# Patient Record
Sex: Female | Born: 1995 | Race: Black or African American | Hispanic: No | Marital: Single | Smoking: Former smoker
Health system: Southern US, Community
[De-identification: ages and names within clinical notes are randomized; demographics above are authoritative.]

## PROBLEM LIST (undated history)

## (undated) ENCOUNTER — Inpatient Hospital Stay (HOSPITAL_COMMUNITY): Payer: Self-pay

## (undated) DIAGNOSIS — Z789 Other specified health status: Secondary | ICD-10-CM

## (undated) HISTORY — PX: BREAST SURGERY: SHX581

---

## 2016-08-29 ENCOUNTER — Emergency Department (HOSPITAL_COMMUNITY)
Admission: EM | Admit: 2016-08-29 | Discharge: 2016-08-29 | Disposition: A | Discharge status: Home / Self Care | Payer: 59 | Attending: Emergency Medicine | Admitting: Emergency Medicine

## 2016-08-29 ENCOUNTER — Encounter (HOSPITAL_COMMUNITY): Payer: Self-pay | Admitting: Emergency Medicine

## 2016-08-29 DIAGNOSIS — R197 Diarrhea, unspecified: Secondary | ICD-10-CM

## 2016-08-29 DIAGNOSIS — R112 Nausea with vomiting, unspecified: Secondary | ICD-10-CM | POA: Insufficient documentation

## 2016-08-29 DIAGNOSIS — N63 Unspecified lump in unspecified breast: Secondary | ICD-10-CM

## 2016-08-29 DIAGNOSIS — N6320 Unspecified lump in the left breast, unspecified quadrant: Secondary | ICD-10-CM | POA: Insufficient documentation

## 2016-08-29 LAB — URINALYSIS, ROUTINE W REFLEX MICROSCOPIC
BILIRUBIN URINE: NEGATIVE
Glucose, UA: NEGATIVE mg/dL
HGB URINE DIPSTICK: NEGATIVE
KETONES UR: NEGATIVE mg/dL
Leukocytes, UA: NEGATIVE
Nitrite: NEGATIVE
PROTEIN: NEGATIVE mg/dL
Specific Gravity, Urine: 1.017 (ref 1.005–1.030)
pH: 7 (ref 5.0–8.0)

## 2016-08-29 LAB — CBC
HCT: 38.8 % (ref 36.0–46.0)
Hemoglobin: 13.1 g/dL (ref 12.0–15.0)
MCH: 30.8 pg (ref 26.0–34.0)
MCHC: 33.8 g/dL (ref 30.0–36.0)
MCV: 91.1 fL (ref 78.0–100.0)
PLATELETS: 199 10*3/uL (ref 150–400)
RBC: 4.26 MIL/uL (ref 3.87–5.11)
RDW: 12.6 % (ref 11.5–15.5)
WBC: 6.2 10*3/uL (ref 4.0–10.5)

## 2016-08-29 LAB — COMPREHENSIVE METABOLIC PANEL
ALBUMIN: 4.3 g/dL (ref 3.5–5.0)
ALK PHOS: 46 U/L (ref 38–126)
ALT: 7 U/L — AB (ref 14–54)
AST: 15 U/L (ref 15–41)
Anion gap: 5 (ref 5–15)
BILIRUBIN TOTAL: 0.5 mg/dL (ref 0.3–1.2)
BUN: 8 mg/dL (ref 6–20)
CALCIUM: 9.3 mg/dL (ref 8.9–10.3)
CO2: 25 mmol/L (ref 22–32)
CREATININE: 0.85 mg/dL (ref 0.44–1.00)
Chloride: 107 mmol/L (ref 101–111)
GFR calc Af Amer: >60 mL/min (ref >60)
GFR calc non Af Amer: >60 mL/min (ref >60)
GLUCOSE: 113 mg/dL — AB (ref 65–99)
Potassium: 3.5 mmol/L (ref 3.5–5.1)
SODIUM: 137 mmol/L (ref 135–145)
Total Protein: 6.5 g/dL (ref 6.5–8.1)

## 2016-08-29 LAB — HCG, QUANTITATIVE, PREGNANCY

## 2016-08-29 LAB — LIPASE, BLOOD: Lipase: 39 U/L (ref 11–51)

## 2016-08-29 MED ORDER — ONDANSETRON 4 MG PO TBDP: ORAL_TABLET | ORAL | 0 refills | Status: AC

## 2016-08-29 MED ORDER — ONDANSETRON 4 MG PO TBDP
4.0000 mg | ORAL_TABLET | Freq: Once | ORAL | Status: AC | PRN
  Administered 2016-08-29: 4 mg via ORAL
  Filled 2016-08-29: qty 1

## 2016-08-29 NOTE — Discharge Instructions (Signed)

## 2016-08-29 NOTE — ED Notes (Signed)
Pt tolerating water, no nausea presently

## 2016-08-29 NOTE — ED Provider Notes (Signed)
[facility]EMERGENCY DEPT Provider Note   CSN: [id number] Arrival date & time: 08/29/16  0132  By signing my name below, I, Rosario AdieWilliam Andrew Hiatt, attest that this documentation has been prepared under the direction and in the presence of [facility] Center[facility]ole Shamecca Whitebread, PA-C.  Electronically Signed: Rosario AdieWilliam Andrew Hiatt, ED Scribe. 08/29/16. 3:49 AM.  History   Chief Complaint Chief Complaint  Patient presents with  . Breast Mass  . Emesis   The history is provided by the patient. No language interpreter was used.    HPI Comments: Cheyenne MartyrLauren Mccleese is a 21 y.o. female who presents to the Emergency Department complaining of persistent nausea, vomiting, and watery diarrhea beginning three days ago. She notes associated chills. Additionally, she notes that she has noticed several areas of swelling to both of her breasts and she is requesting resources and referral for these. No noted treatments for her symptoms were tried prior to coming into the ED. No known sick contacts with similar symptoms. Of note, pt reports that she had a similar breast mass removed ~6 years ago; at that time her mass was ruled as benign in nature. No FHx of breast cancer. She denies fever, abdominal pain, urgency, frequency, hematuria, dysuria, difficulty urinating, or any other associated symptoms.   History reviewed. No pertinent past medical history.  There are no active problems to display for this patient.   History reviewed. No pertinent surgical history.  OB History    No data available     Home Medications    Prior to Admission medications   Medication Sig Start Date End Date Taking? Authorizing Provider  ondansetron (ZOFRAN ODT) 4 MG disintegrating tablet 4mg  ODT q4 hours prn nausea/vomit 08/29/16   Corin Tilly, Joni ReiningNicole, PA-C   Family History No family history on file.  Social History Social History  Substance Use Topics  . Smoking status: Not on file  . Smokeless tobacco: Not on file  . Alcohol use Not on file    Allergies   Patient has no known allergies.  Review of Systems Review of Systems  A complete review of systems was obtained and all systems are negative except as noted in the HPI and PMH.   Physical Exam Updated Vital Signs BP 116/67   Pulse 62   Temp 98.5 F (36.9 C) (Oral)   Resp 16   Ht 5' 8.5" (1.74 m)   Wt 72.3 kg (159 lb 5 oz)   SpO2 94%   BMI 23.87 kg/m   Physical Exam  Constitutional: She is oriented to person, place, and time. She appears well-developed and well-nourished. No distress.  HENT:  Head: Normocephalic and atraumatic.  Mouth/Throat: Oropharynx is clear and moist.  Eyes: Pupils are equal, round, and reactive to light. Conjunctivae and EOM are normal.  Neck: Normal range of motion.  Cardiovascular: Normal rate, regular rhythm and intact distal pulses.   Pulmonary/Chest: Effort normal and breath sounds normal.  Breast exam is chaperoned by nurse: No nipple discharge, no axillary lymphadenopathy. Patient has proximally 1 cm firm, slightly tender, mobile mass to the left breast at approximately 8:00. She has a less distinct mass on the right breast at 2:00.  Abdominal: Soft. She exhibits no distension and no mass. There is no tenderness. There is no rebound and no guarding. No hernia.  Musculoskeletal: Normal range of motion.  Neurological: She is alert and oriented to person, place, and time.  Skin: Capillary refill takes less than 2 seconds. She is not diaphoretic. No pallor.  Psychiatric: She has  a normal mood and affect. Her behavior is normal.  Nursing note and vitals reviewed.  ED Treatments / Results  DIAGNOSTIC STUDIES: Oxygen Saturation is 100% on RA, normal by my interpretation.   COORDINATION OF CARE: 3:49 AM-Discussed next steps with pt. Pt verbalized understanding and is agreeable with the plan.   Labs (all labs ordered are listed, but only abnormal results are displayed) Labs Reviewed  COMPREHENSIVE METABOLIC PANEL - Abnormal;  Notable for the following:       Result Value   Glucose, Bld 113 (*)    ALT 7 (*)    All other components within normal limits  URINALYSIS, ROUTINE W REFLEX MICROSCOPIC - Abnormal; Notable for the following:    APPearance CLOUDY (*)    All other components within normal limits  LIPASE, BLOOD  CBC  HCG, QUANTITATIVE, PREGNANCY   EKG  EKG Interpretation None      Radiology No results found.  Procedures Procedures   Medications Ordered in ED Medications  ondansetron (ZOFRAN-ODT) disintegrating tablet 4 mg (4 mg Oral Given 08/29/16 0410)   Initial Impression / Assessment and Plan / ED Course  I have reviewed the triage vital signs and the nursing notes.  Pertinent labs & imaging results that were available during my care of the patient were reviewed by me and considered in my medical decision making (see chart for details).     Vitals:   08/29/16 0315 08/29/16 0330 08/29/16 0345 08/29/16 0400  BP: 111/72 113/78 112/78 116/67  Pulse: 68 68 63 62  Resp:    16  Temp:      TempSrc:      SpO2: 100% 100% 99% 94%  Weight:      Height:        Medications  ondansetron (ZOFRAN-ODT) disintegrating tablet 4 mg (4 mg Oral Given 08/29/16 0410)    Cheyenne Bowman is 21 y.o. female presenting with Nausea vomiting diarrhea. Patient has no signs of systemic infection, she denies abdominal pain and abdominal exam is benign. Vital signs and blood work are reassuring. She also does have several breast masses. She has no family history of breast cancer and no B signs. Patient referred to OB/GYN for further workup of breast mass. She tolerated by mouth in the ED. Will send her home with prescription for Zofran, counseled her on hand hygiene for possible viral gastritis.  Evaluation does not show pathology that would require ongoing emergent intervention or inpatient treatment. Pt is hemodynamically stable and mentating appropriately. Discussed findings and plan with patient/guardian, who  agrees with care plan. All questions answered. Return precautions discussed and outpatient follow up given.      Final Clinical Impressions(s) / ED Diagnoses   Final diagnoses:  Nausea vomiting and diarrhea  Breast mass in female   New Prescriptions Discharge Medication List as of 08/29/2016  4:49 AM    START taking these medications   Details  ondansetron (ZOFRAN ODT) 4 MG disintegrating tablet 4mg  ODT q4 hours prn nausea/vomit, Print       I personally performed the services described in this documentation, which was scribed in my presence. The recorded information has been reviewed and is accurate.     Kaylyn Lim 08/29/16 1610    Ward, Layla Maw, DO 08/29/16 2311

## 2016-08-29 NOTE — ED Triage Notes (Signed)
Pt is concerned about the two masses she found on both breasts.  She has also had nausea and vomiting for three days.

## 2018-02-28 ENCOUNTER — Encounter (HOSPITAL_COMMUNITY): Payer: Self-pay | Admitting: *Deleted

## 2018-02-28 ENCOUNTER — Inpatient Hospital Stay (HOSPITAL_COMMUNITY)
Admission: AD | Admit: 2018-02-28 | Discharge: 2018-02-28 | Disposition: A | Discharge status: Home / Self Care | Payer: 59 | Attending: Obstetrics & Gynecology | Admitting: Obstetrics & Gynecology

## 2018-02-28 ENCOUNTER — Inpatient Hospital Stay (HOSPITAL_COMMUNITY): Payer: 59

## 2018-02-28 DIAGNOSIS — Z3A10 10 weeks gestation of pregnancy: Secondary | ICD-10-CM | POA: Diagnosis not present

## 2018-02-28 DIAGNOSIS — Z3A01 Less than 8 weeks gestation of pregnancy: Secondary | ICD-10-CM

## 2018-02-28 DIAGNOSIS — O26891 Other specified pregnancy related conditions, first trimester: Secondary | ICD-10-CM | POA: Diagnosis not present

## 2018-02-28 DIAGNOSIS — O219 Vomiting of pregnancy, unspecified: Secondary | ICD-10-CM

## 2018-02-28 DIAGNOSIS — R109 Unspecified abdominal pain: Secondary | ICD-10-CM | POA: Diagnosis present

## 2018-02-28 DIAGNOSIS — Z3490 Encounter for supervision of normal pregnancy, unspecified, unspecified trimester: Secondary | ICD-10-CM

## 2018-02-28 LAB — CBC WITH DIFFERENTIAL/PLATELET
BASOS ABS: 0 10*3/uL (ref 0.0–0.1)
Basophils Relative: 1 %
EOS ABS: 0.1 10*3/uL (ref 0.0–0.5)
EOS PCT: 2 %
HCT: 41.1 % (ref 36.0–46.0)
HEMOGLOBIN: 13.7 g/dL (ref 12.0–15.0)
Lymphocytes Relative: 34 %
Lymphs Abs: 2 10*3/uL (ref 0.7–4.0)
MCH: 30.8 pg (ref 26.0–34.0)
MCHC: 33.3 g/dL (ref 30.0–36.0)
MCV: 92.4 fL (ref 80.0–100.0)
MONOS PCT: 6 %
Monocytes Absolute: 0.4 10*3/uL (ref 0.1–1.0)
NEUTROS PCT: 57 %
NRBC: 0 % (ref 0.0–0.2)
Neutro Abs: 3.3 10*3/uL (ref 1.7–7.7)
Platelets: 218 10*3/uL (ref 150–400)
RBC: 4.45 MIL/uL (ref 3.87–5.11)
RDW: 12.4 % (ref 11.5–15.5)
WBC: 5.8 10*3/uL (ref 4.0–10.5)

## 2018-02-28 LAB — URINALYSIS, ROUTINE W REFLEX MICROSCOPIC
Bilirubin Urine: NEGATIVE
GLUCOSE, UA: NEGATIVE mg/dL
Hgb urine dipstick: NEGATIVE
Ketones, ur: 20 mg/dL — AB
LEUKOCYTES UA: NEGATIVE
NITRITE: NEGATIVE
Protein, ur: NEGATIVE mg/dL
Specific Gravity, Urine: 1.019 (ref 1.005–1.030)
pH: 6 (ref 5.0–8.0)

## 2018-02-28 LAB — HCG, QUANTITATIVE, PREGNANCY: hCG, Beta Chain, Quant, S: 9597 m[IU]/mL — ABNORMAL HIGH (ref <5)

## 2018-02-28 LAB — TYPE AND SCREEN
ABO/RH(D): A POS
Antibody Screen: NEGATIVE

## 2018-02-28 LAB — ABO/RH: ABO/RH(D): A POS

## 2018-02-28 LAB — POCT PREGNANCY, URINE: PREG TEST UR: POSITIVE — AB

## 2018-02-28 MED ORDER — PREPLUS 27-1 MG PO TABS
1.0000 | ORAL_TABLET | Freq: Every day | ORAL | 13 refills | Status: AC
Start: 2018-02-28 — End: ?

## 2018-02-28 MED ORDER — DOXYLAMINE-PYRIDOXINE ER 20-20 MG PO TBCR: 1.0000 | EXTENDED_RELEASE_TABLET | Freq: Two times a day (BID) | ORAL | 2 refills | Status: AC

## 2018-02-28 NOTE — MAU Note (Signed)
Pt reports lower abd cramping since this am, pain was sharp and intense.pt states she had a positive preg test at md office .denies bleeding.

## 2018-02-28 NOTE — Discharge Instructions (Signed)

## 2018-02-28 NOTE — MAU Provider Note (Signed)
History     CSN: [id number]  Arrival date and time: 02/28/18 1306   None     Chief Complaint  Patient presents with  . Abdominal Pain   HPI Cheyenne Bowman is a 23 y.o. G1P0 at 10w 0d by LMP of 12/20/17 who presents to MAU with chief complaint of sharp abdominal pain. This is a new problem which woke her from sleep this morning then resolved without intervention. She denies vaginal bleeding, abnormal vaginal discharge, fever or recent illness.  Patient also complains of nausea and vomiting for the past two weeks. She is tolerating PO but states nausea is very severe in the morning. She is requesting medication to help with this problem.  OB History    Gravida  1   Para      Term      Preterm      AB      Living        SAB      TAB      Ectopic      Multiple      Live Births              No past medical history on file.  No past surgical history on file.  No family history on file.  Social History   Tobacco Use  . Smoking status: Not on file  Substance Use Topics  . Alcohol use: Not on file  . Drug use: Not on file    Allergies: No Known Allergies  Medications Prior to Admission  Medication Sig Dispense Refill Last Dose  . ondansetron (ZOFRAN ODT) 4 MG disintegrating tablet 4mg  ODT q4 hours prn nausea/vomit 4 tablet 0     Review of Systems  Constitutional: Negative for chills, fatigue and fever.  Gastrointestinal: Positive for abdominal pain.  Genitourinary: Negative for difficulty urinating, dysuria, flank pain, vaginal bleeding, vaginal discharge and vaginal pain.  Musculoskeletal: Negative for back pain.  Neurological: Negative for dizziness and headaches.  All other systems reviewed and are negative.  Physical Exam   Blood pressure 103/63, pulse 83, temperature 98.3 F (36.8 C), resp. rate (!) 100, height 5\' 9"  (1.753 m), weight 74.4 kg, SpO2 100 %.  Physical Exam  Nursing note and vitals reviewed. Constitutional: She is oriented  to person, place, and time. She appears well-developed and well-nourished.  Cardiovascular: Normal rate.  Respiratory: Effort normal.  GI: Soft. Bowel sounds are normal. She exhibits no distension. There is no abdominal tenderness. There is no rebound, no guarding and no CVA tenderness.  Neurological: She is alert and oriented to person, place, and time.  Skin: Skin is warm and dry.  Psychiatric: She has a normal mood and affect. Her behavior is normal. Judgment and thought content normal.    MAU Course/MDM    --Abdominal cramp x 1. Denies pain on arrival to MAU --No bleeding at any time   Patient Vitals for the past 24 hrs:  BP Temp Pulse Resp SpO2 Height Weight  02/28/18 1413 103/63 98.3 F (36.8 C) 83 (!) 100 100 % 5\' 9"  (1.753 m) 74.4 kg    Results for orders placed or performed during the hospital encounter of 02/28/18 (from the past 24 hour(s))  Urinalysis, Routine w reflex microscopic     Status: Abnormal   Collection Time: 02/28/18  2:21 PM  Result Value Ref Range   Color, Urine YELLOW YELLOW   APPearance HAZY (A) CLEAR   Specific Gravity, Urine 1.019 1.005 - 1.030  pH 6.0 5.0 - 8.0   Glucose, UA NEGATIVE NEGATIVE mg/dL   Hgb urine dipstick NEGATIVE NEGATIVE   Bilirubin Urine NEGATIVE NEGATIVE   Ketones, ur 20 (A) NEGATIVE mg/dL   Protein, ur NEGATIVE NEGATIVE mg/dL   Nitrite NEGATIVE NEGATIVE   Leukocytes, UA NEGATIVE NEGATIVE  Pregnancy, urine POC     Status: Abnormal   Collection Time: 02/28/18  2:24 PM  Result Value Ref Range   Preg Test, Ur POSITIVE (A) NEGATIVE  CBC with Differential/Platelet     Status: None   Collection Time: 02/28/18  2:51 PM  Result Value Ref Range   WBC 5.8 4.0 - 10.5 K/uL   RBC 4.45 3.87 - 5.11 MIL/uL   Hemoglobin 13.7 12.0 - 15.0 g/dL   HCT 60.0 45.9 - 97.7 %   MCV 92.4 80.0 - 100.0 fL   MCH 30.8 26.0 - 34.0 pg   MCHC 33.3 30.0 - 36.0 g/dL   RDW 41.4 23.9 - 53.2 %   Platelets 218 150 - 400 K/uL   nRBC 0.0 0.0 - 0.2 %    Neutrophils Relative % 57 %   Neutro Abs 3.3 1.7 - 7.7 K/uL   Lymphocytes Relative 34 %   Lymphs Abs 2.0 0.7 - 4.0 K/uL   Monocytes Relative 6 %   Monocytes Absolute 0.4 0.1 - 1.0 K/uL   Eosinophils Relative 2 %   Eosinophils Absolute 0.1 0.0 - 0.5 K/uL   Basophils Relative 1 %   Basophils Absolute 0.0 0.0 - 0.1 K/uL  Type and screen [facility]     Status: None   Collection Time: 02/28/18  2:51 PM  Result Value Ref Range   ABO/RH(D) A POS    Antibody Screen NEG    Sample Expiration      03/03/2018 Performed at [facility], [address] [place], [place] [address]   hCG, quantitative, pregnancy     Status: Abnormal   Collection Time: 02/28/18  2:51 PM  Result Value Ref Range   hCG, Beta Chain, Quant, S 9,597 (H) <5 mIU/mL    US Ob Less Than 14 Weeks With Ob Transvaginal  Result Date: 02/28/2018 CLINICAL DATA:  Pregnant patient with abdominal pain first trimester. EXAM: OBSTETRIC <14 WK [place] AND TRANSVAGINAL OB US TECHNIQUE: Both transabdominal and transvaginal ultrasound examinations were performed for complete evaluation of the gestation as well as the maternal uterus, adnexal regions, and pelvic cul-de-sac. Transvaginal technique was performed to assess early pregnancy. COMPARISON:  None. FINDINGS: Intrauterine gestational sac: Single Yolk sac:  Probable Embryo:  Not Visualized. Cardiac Activity: Not Visualized. MSD: 8.6 mm   5 w   4 d Subchorionic hemorrhage:  None visualized. Maternal uterus/adnexae: Normal right and left ovaries. Right ovarian corpus luteum. No free fluid in the pelvis. IMPRESSION: Probable early intrauterine gestational sac and yolk sac but no fetal pole or cardiac activity yet visualized. Recommend follow-up quantitative B-HCG levels and follow-up US in 14 days to assess viability. This recommendation follows SRU consensus guidelines: Diagnostic Criteria for Nonviable Pregnancy Early in the First Trimester. Malva Limes Med 2013; 356:8616-83.  Electronically Signed   By: Annia Belt M.D.   On: 02/28/2018 16:59   Assessment and Plan  --23 y.o. G1P0 with SIUP at 5w 4 d by [place] --Reviewed safe medications and pregnancy and timing for initiating prenatal care --Discharge home in stable condition  F/U: Patient to initiate prenatal care around 78 weeks  Calvert Cantor, CNM 02/28/2018, 5:04 PM

## 2018-03-26 ENCOUNTER — Other Ambulatory Visit: Payer: Self-pay

## 2018-03-26 ENCOUNTER — Inpatient Hospital Stay (HOSPITAL_COMMUNITY)
Admission: AD | Admit: 2018-03-26 | Discharge: 2018-03-26 | Disposition: A | Discharge status: Home / Self Care | Payer: 59 | Attending: Obstetrics & Gynecology | Admitting: Obstetrics & Gynecology

## 2018-03-26 ENCOUNTER — Inpatient Hospital Stay (HOSPITAL_COMMUNITY): Payer: 59

## 2018-03-26 ENCOUNTER — Encounter (HOSPITAL_COMMUNITY): Payer: Self-pay

## 2018-03-26 DIAGNOSIS — B9689 Other specified bacterial agents as the cause of diseases classified elsewhere: Secondary | ICD-10-CM

## 2018-03-26 DIAGNOSIS — Z3A09 9 weeks gestation of pregnancy: Secondary | ICD-10-CM | POA: Diagnosis not present

## 2018-03-26 DIAGNOSIS — O23591 Infection of other part of genital tract in pregnancy, first trimester: Secondary | ICD-10-CM | POA: Insufficient documentation

## 2018-03-26 DIAGNOSIS — Z3491 Encounter for supervision of normal pregnancy, unspecified, first trimester: Secondary | ICD-10-CM

## 2018-03-26 DIAGNOSIS — N760 Acute vaginitis: Secondary | ICD-10-CM | POA: Diagnosis not present

## 2018-03-26 DIAGNOSIS — O209 Hemorrhage in early pregnancy, unspecified: Secondary | ICD-10-CM | POA: Diagnosis not present

## 2018-03-26 DIAGNOSIS — R109 Unspecified abdominal pain: Secondary | ICD-10-CM

## 2018-03-26 DIAGNOSIS — O26891 Other specified pregnancy related conditions, first trimester: Secondary | ICD-10-CM

## 2018-03-26 DIAGNOSIS — Z87891 Personal history of nicotine dependence: Secondary | ICD-10-CM | POA: Insufficient documentation

## 2018-03-26 DIAGNOSIS — O208 Other hemorrhage in early pregnancy: Secondary | ICD-10-CM

## 2018-03-26 DIAGNOSIS — O26899 Other specified pregnancy related conditions, unspecified trimester: Secondary | ICD-10-CM

## 2018-03-26 DIAGNOSIS — Z3490 Encounter for supervision of normal pregnancy, unspecified, unspecified trimester: Secondary | ICD-10-CM

## 2018-03-26 HISTORY — DX: Other specified health status: Z78.9

## 2018-03-26 LAB — URINALYSIS, ROUTINE W REFLEX MICROSCOPIC
Bilirubin Urine: NEGATIVE
Glucose, UA: NEGATIVE mg/dL
Hgb urine dipstick: NEGATIVE
Ketones, ur: NEGATIVE mg/dL
Leukocytes,Ua: NEGATIVE
Nitrite: NEGATIVE
Protein, ur: NEGATIVE mg/dL
Specific Gravity, Urine: 1.01 (ref 1.005–1.030)
pH: 8.5 — ABNORMAL HIGH (ref 5.0–8.0)

## 2018-03-26 LAB — WET PREP, GENITAL
Sperm: NONE SEEN
TRICH WET PREP: NONE SEEN
Yeast Wet Prep HPF POC: NONE SEEN

## 2018-03-26 MED ORDER — METRONIDAZOLE 0.75 % VA GEL: 1.0000 | Freq: Every day | VAGINAL | 0 refills | Status: AC

## 2018-03-26 NOTE — MAU Note (Signed)
Last night noted she was bleeding and had been cramping.  This morning was still bleeding.

## 2018-03-26 NOTE — Discharge Instructions (Signed)
You can take Tylenol 1000 mg every 6 hrs as needed for pain. Below is a list of safe medications you can take in pregnancy. Please start prenatal care as soon as possible.   Safe Medications in Pregnancy   Acne: Benzoyl Peroxide Salicylic Acid  Backache/Headache: Tylenol: 2 regular strength every 4 hours OR              2 Extra strength every 6 hours  Colds/Coughs/Allergies: Benadryl (alcohol free) 25 mg every 6 hours as needed Breath right strips Claritin Cepacol throat lozenges Chloraseptic throat spray Cold-Eeze- up to three times per day Cough drops, alcohol free Flonase (by prescription only) Guaifenesin Mucinex Robitussin DM (plain only, alcohol free) Saline nasal spray/drops Sudafed (pseudoephedrine) & Actifed ** use only after [redacted] weeks gestation and if you do not have high blood pressure Tylenol Vicks Vaporub Zinc lozenges Zyrtec   Constipation: Colace Ducolax suppositories Fleet enema Glycerin suppositories Metamucil Milk of magnesia Miralax Senokot Smooth move tea  Diarrhea: Kaopectate Imodium A-D  *NO pepto Bismol  Hemorrhoids: Anusol Anusol HC Preparation H Tucks  Indigestion: Tums Maalox Mylanta Zantac  Pepcid  Insomnia: Benadryl (alcohol free) 25mg  every 6 hours as needed Tylenol PM Unisom, no Gelcaps  Leg Cramps: Tums MagGel  Nausea/Vomiting:  Bonine Dramamine Emetrol Ginger extract Sea bands Meclizine  Nausea medication to take during pregnancy:  Unisom (doxylamine succinate 25 mg tablets) Take one tablet daily at bedtime. If symptoms are not adequately controlled, the dose can be increased to a maximum recommended dose of two tablets daily (1/2 tablet in the morning, 1/2 tablet mid-afternoon and one at bedtime). Vitamin B6 100mg  tablets. Take one tablet twice a day (up to 200 mg per day).  Skin Rashes: Aveeno products Benadryl cream or 25mg  every 6 hours as needed Calamine Lotion 1% cortisone cream  Yeast  infection: Gyne-lotrimin 7 Monistat 7   **If taking multiple medications, please check labels to avoid duplicating the same active ingredients **take medication as directed on the label ** Do not exceed 4000 mg of tylenol in 24 hours **Do not take medications that contain aspirin or ibuprofen

## 2018-03-26 NOTE — MAU Provider Note (Signed)
History     CSN: [id number]  Arrival date and time: 03/26/18 1220   First Provider Initiated Contact with Patient 03/26/18 1317      Chief Complaint  Patient presents with  . Vaginal Bleeding   HPI  Ms.  Cheyenne Bowman is a 23 y.o. year old G1P0 female at [redacted]w[redacted]d weeks gestation who presents to MAU reporting VB and abdominal pain/cramping since last night and continues VB this AM as well. She was seen in MAU on 02/28/2018 where she had HCG 9,597, IUGS was seen and a probable YS, no embryo. She is frightened that something is wrong with her baby; very tearful. Her last SI was about 2-3 wks ago.  Past Medical History:  Diagnosis Date  . Medical history non-contributory     Past Surgical History:  Procedure Laterality Date  . BREAST SURGERY     Masses removed from left breast    Family History  Problem Relation Age of Onset  . Cancer Mother        Cervival   . Heart murmur Mother   . Narcolepsy Mother   . Diabetes Father     Social History   Tobacco Use  . Smoking status: Former Games developer  . Smokeless tobacco: Never Used  Substance Use Topics  . Alcohol use: Not Currently    Comment: Occasional  . Drug use: Not Currently    Types: Marijuana    Comment: stopped with pregnancy    Allergies: No Known Allergies  No medications prior to admission.    Review of Systems  Constitutional: Negative.   HENT: Negative.   Eyes: Negative.   Respiratory: Negative.   Cardiovascular: Negative.   Gastrointestinal: Negative.   Endocrine: Negative.   Genitourinary: Positive for pelvic pain and vaginal bleeding.  Musculoskeletal: Negative.   Skin: Negative.   Allergic/Immunologic: Negative.   Neurological: Negative.   Hematological: Negative.   Psychiatric/Behavioral: Negative.    Physical Exam   Blood pressure 111/66, pulse 90, temperature 99 F (37.2 C), temperature source Oral, resp. rate 18, weight 71.1 kg, SpO2 98 %.  Physical Exam  Nursing note and vitals  reviewed. Constitutional: She is oriented to person, place, and time. She appears well-developed and well-nourished.  HENT:  Head: Normocephalic and atraumatic.  Eyes: Pupils are equal, round, and reactive to light.  Neck: Normal range of motion.  Cardiovascular: Normal rate.  Respiratory: Effort normal.  GI: Soft.  Genitourinary:    Genitourinary Comments: SE: cervix is smooth, pink, no lesions, copious amt of thick, white, frothy, malodorous vaginal d/c -- patient not tolerant of SE and asked CNM to stop exam -- WP, GC/CT done by self swab, cervix: visually closed/long   Musculoskeletal: Normal range of motion.  Neurological: She is alert and oriented to person, place, and time.  Skin: Skin is warm and dry.  Psychiatric: She has a normal mood and affect. Her behavior is normal. Judgment and thought content normal.    MAU Course  Procedures  MDM CCUA Wet Prep  GC/CT -- pending OB F/U < 14 wks U/S with TV  Results for orders placed or performed during the hospital encounter of 03/26/18 (from the past 24 hour(s))  Urinalysis, Routine w reflex microscopic     Status: Abnormal   Collection Time: 03/26/18  1:31 PM  Result Value Ref Range   Color, Urine YELLOW YELLOW   APPearance CLEAR CLEAR   Specific Gravity, Urine 1.010 1.005 - 1.030   pH 8.5 (H) 5.0 - 8.0  Glucose, UA NEGATIVE NEGATIVE mg/dL   Hgb urine dipstick NEGATIVE NEGATIVE   Bilirubin Urine NEGATIVE NEGATIVE   Ketones, ur NEGATIVE NEGATIVE mg/dL   Protein, ur NEGATIVE NEGATIVE mg/dL   Nitrite NEGATIVE NEGATIVE   Leukocytes,Ua NEGATIVE NEGATIVE  Wet prep, genital     Status: Abnormal   Collection Time: 03/26/18  1:34 PM  Result Value Ref Range   Yeast Wet Prep HPF POC NONE SEEN NONE SEEN   Trich, Wet Prep NONE SEEN NONE SEEN   Clue Cells Wet Prep HPF POC PRESENT (A) NONE SEEN   WBC, Wet Prep HPF POC FEW (A) NONE SEEN   Sperm NONE SEEN    [place] Ob Comp Less 14 Wks  Result Date: 03/26/2018 CLINICAL DATA:   Abdominal pain and vaginal bleeding for 1 day. 1st trimester pregnancy. Unknown LMP. EXAM: OBSTETRIC <14 WK ULTRASOUND TECHNIQUE: Transabdominal ultrasound was performed for evaluation of the gestation as well as the maternal uterus and adnexal regions. COMPARISON:  02/28/2018 FINDINGS: Intrauterine gestational sac: Single Yolk sac:  Visualized. Embryo:  Visualized. Cardiac Activity: Visualized. Heart Rate: 167 bpm CRL:   22 mm   8 w 5 d                  US EDC: 10/31/2018 Subchorionic hemorrhage:  None visualized. Maternal uterus/adnexae: Normal appearance of left ovary. Right ovary is not directly visualized, however no adnexal mass or abnormal free fluid identified. IMPRESSION: Single living IUP measuring 8 weeks 5 days, with US EDC of 10/31/2018. No significant maternal uterine or adnexal abnormality identified. Electronically Signed   By: Myles RosenthalJohn  Stahl M.D.   On: 03/26/2018 14:34    Assessment and Plan  Bacterial vaginosis  - Rx for Metrogel apply vaginally hs x 5 days - Information provided on BV  Abdominal pain in pregnancy - Advised to take Tylenol 1000 mg every 6 hrs prn pain - Information provided on abdominal pain in pregnancy   Vaginal bleeding affecting early pregnancy  - Information provided on VB in pregnancy   Intrauterine pregnancy - Information provided on first trimester pregnancy - [facility]  - Discharge home - Patient verbalized an understanding of the plan of care and agrees.    Cheyenne Moraolitta Belladonna Lubinski, MSN, CNM 03/26/2018, 1:18 PM

## 2018-03-27 LAB — GC/CHLAMYDIA PROBE AMP (~~LOC~~) NOT AT ARMC
CHLAMYDIA, DNA PROBE: NEGATIVE
NEISSERIA GONORRHEA: NEGATIVE

## 2019-01-19 ENCOUNTER — Encounter (HOSPITAL_COMMUNITY): Payer: Self-pay

## 2019-12-07 IMAGING — US US OB < 14 WEEKS - US OB TV
1 series · 15 of 28 positions shown · non-contrast
Comparison: None.

CLINICAL DATA: Pregnant patient with abdominal pain first
trimester.

EXAM:
OBSTETRIC <14 WK US AND TRANSVAGINAL OB US
TECHNIQUE: Both transabdominal and transvaginal ultrasound examinations were
performed for complete evaluation of the gestation as well as the
maternal uterus, adnexal regions, and pelvic cul-de-sac.
Transvaginal technique was performed to assess early pregnancy.

[Series 1: us ob < 14 weeks - us ob tv · 15 of 66 slices shown]
[im 1/66]
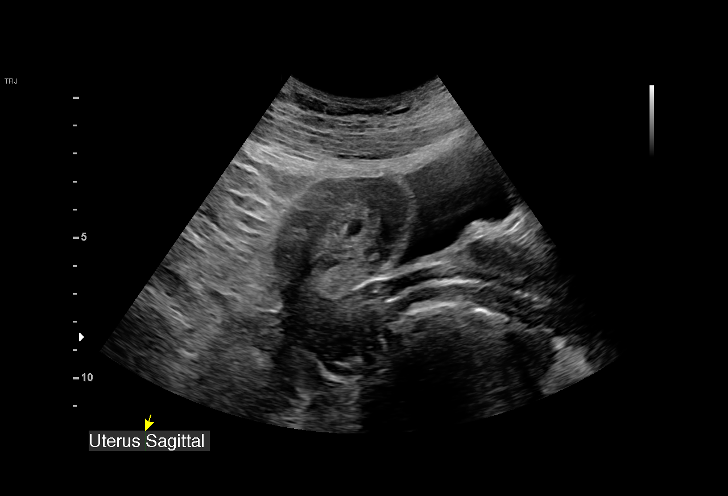
[im 5/66]
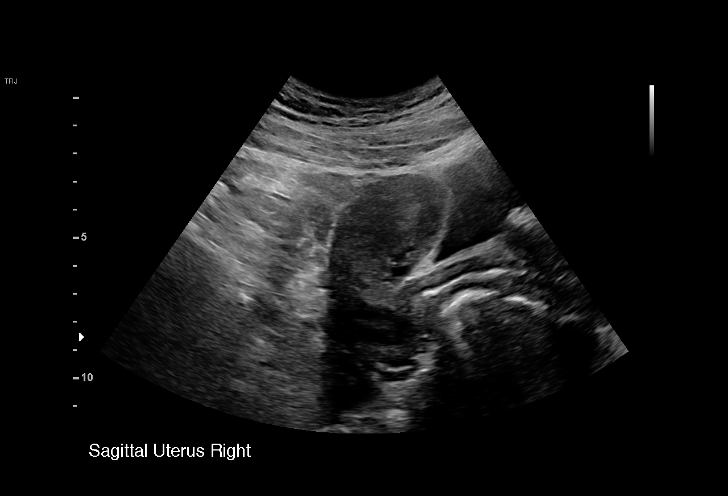
[im 10/66]
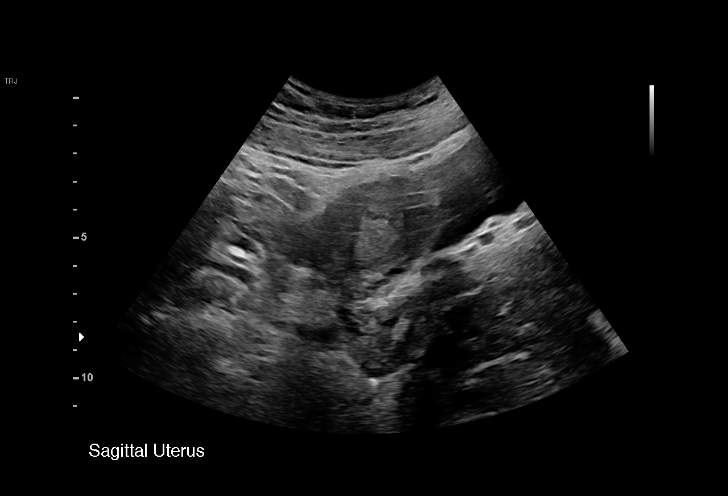
[im 15/66]
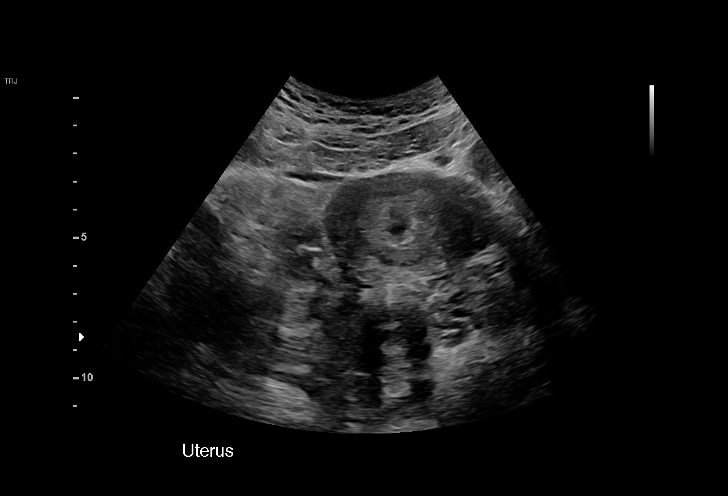
[im 20/66]
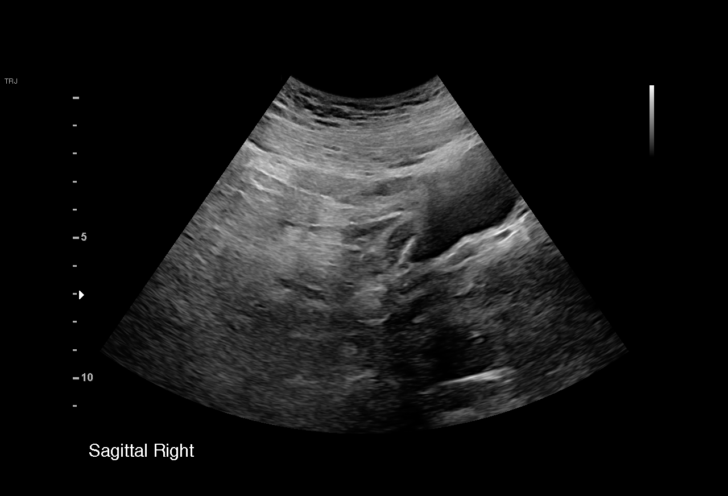
[im 25/66]
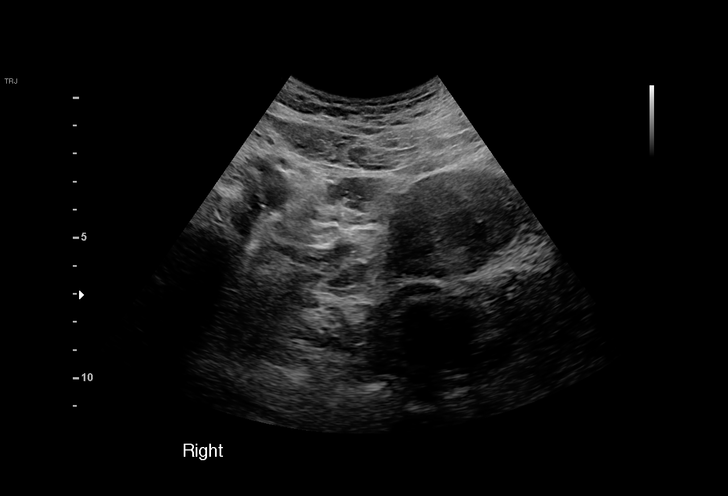
[im 29/66]
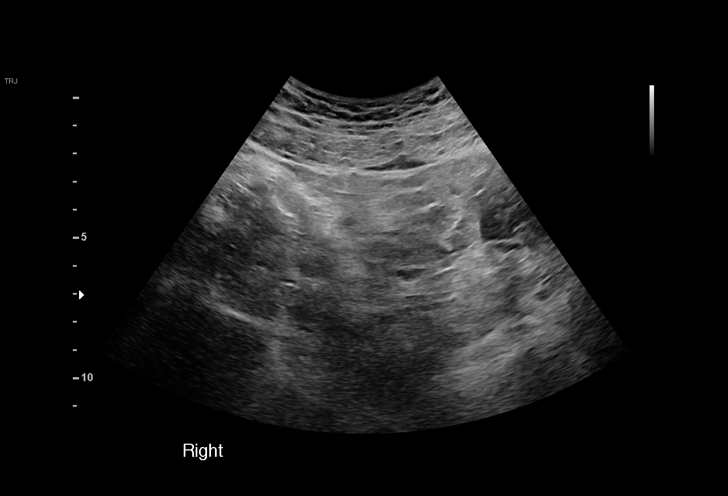
[im 34/66]
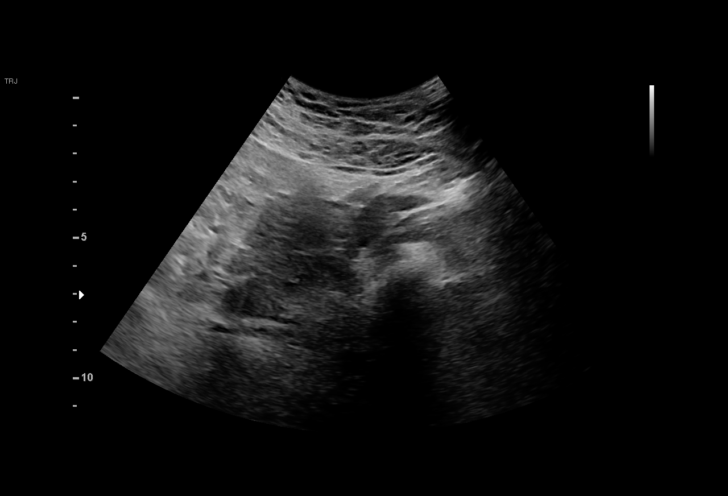
[im 37/66]
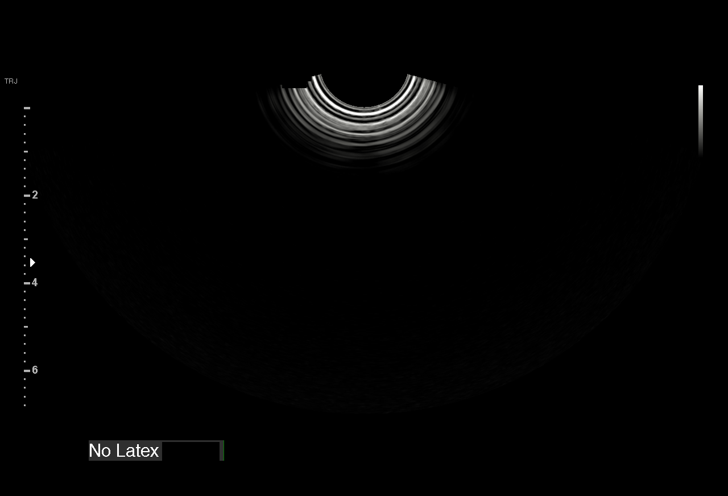
[im 41/66]
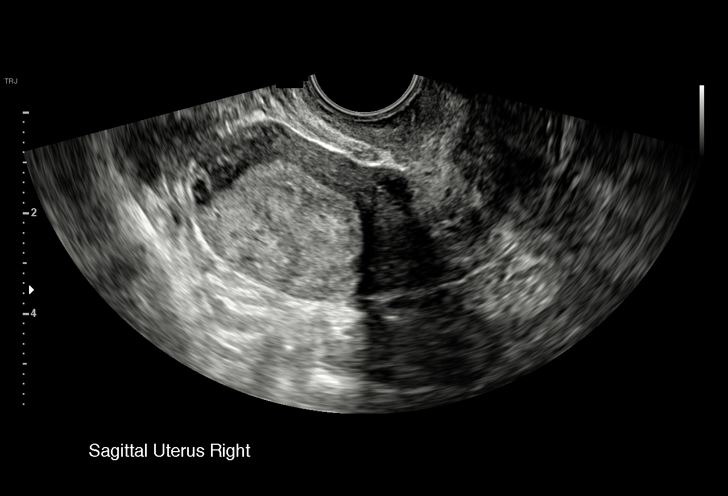
[im 46/66]
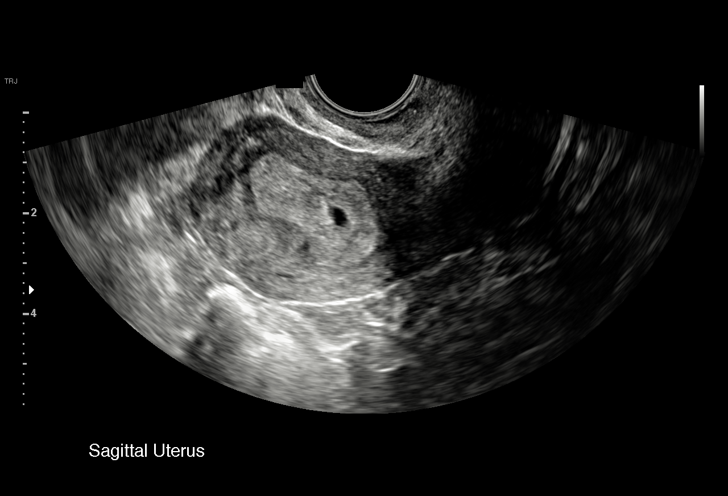
[im 51/66]
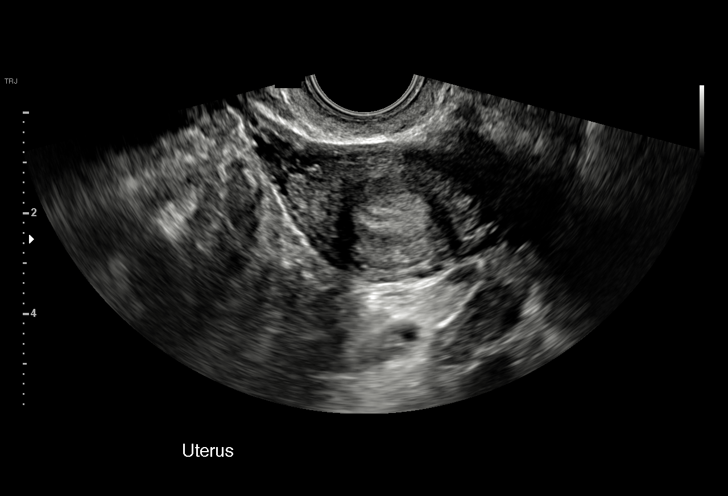
[im 56/66]
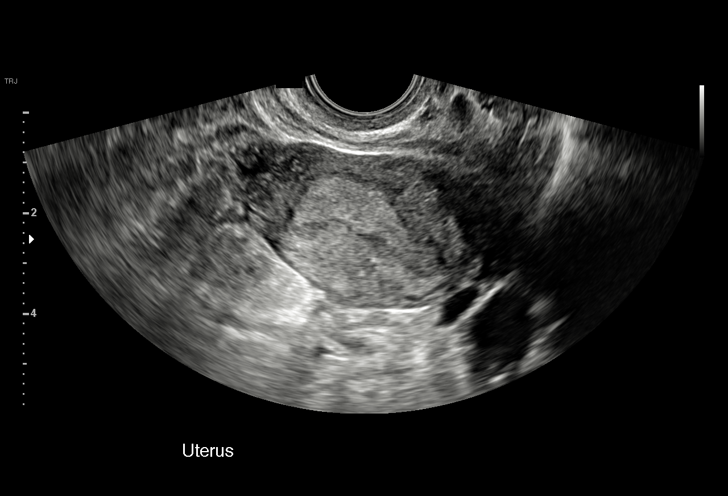
[im 61/66]
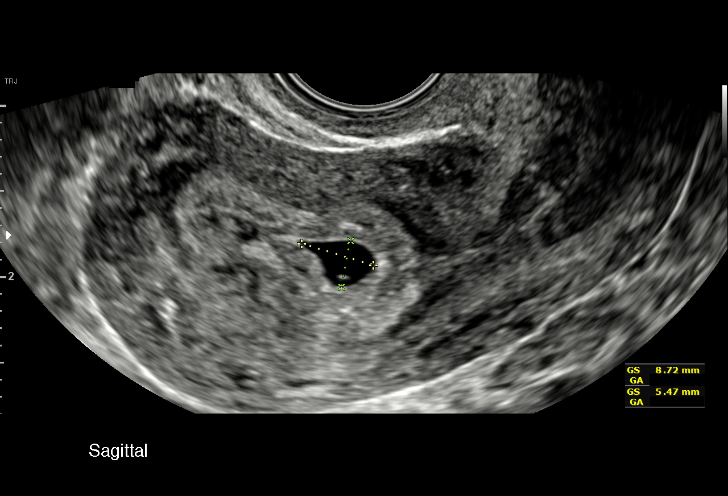
[im 66/66]
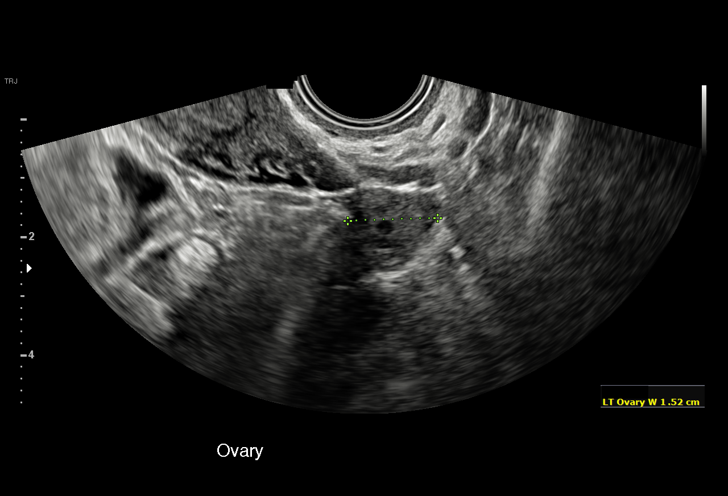

[15 of 28 positions shown; findings below may reference images not displayed]

FINDINGS: Intrauterine gestational sac: Single

Yolk sac:  Probable

Embryo:  Not Visualized.

Cardiac Activity: Not Visualized.

MSD: 8.6 mm   5 w   4 d

Subchorionic hemorrhage:  None visualized.

Maternal uterus/adnexae: Normal right and left ovaries. Right
ovarian corpus luteum. No free fluid in the pelvis.
IMPRESSION: Probable early intrauterine gestational sac and yolk sac but no
fetal pole or cardiac activity yet visualized. Recommend follow-up
quantitative B-HCG levels and follow-up US in 14 days to assess
viability. This recommendation follows SRU consensus guidelines:
Diagnostic Criteria for Nonviable Pregnancy Early in the First
Trimester. N Engl J Med 6014; [DATE].

## 2020-01-02 IMAGING — US US OB COMP LESS 14 WK
1 series · 15 of 28 positions shown · non-contrast
Comparison: 02/28/2018

CLINICAL DATA: Abdominal pain and vaginal bleeding for 1 day. 1st
trimester pregnancy. Unknown LMP.

EXAM:
OBSTETRIC <14 WK ULTRASOUND
TECHNIQUE: Transabdominal ultrasound was performed for evaluation of the
gestation as well as the maternal uterus and adnexal regions.

[Series 1: us ob comp less 14 wk · 15 of 29 slices shown]
[im 1/29]
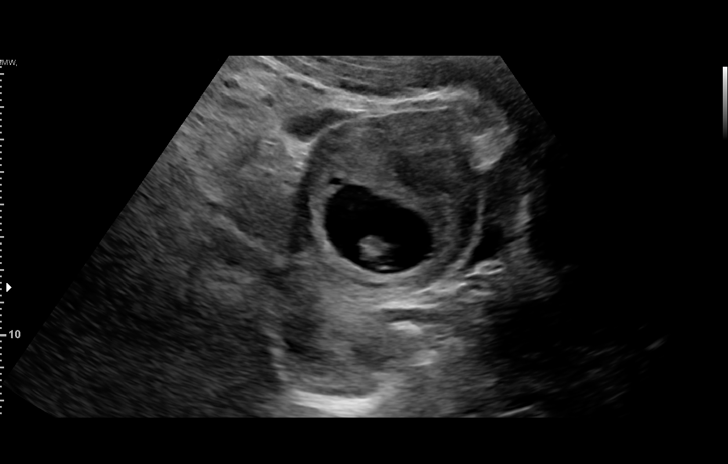
[im 3/29]
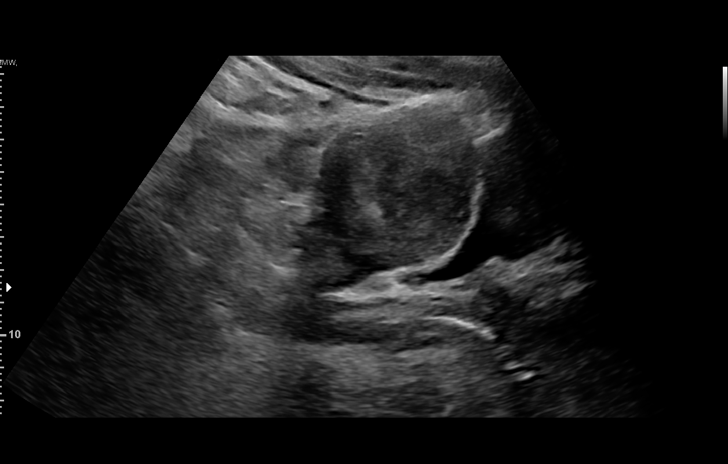
[im 5/29]
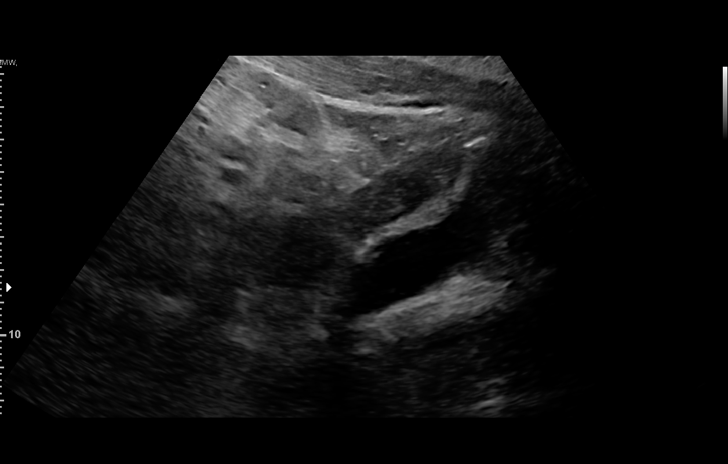
[im 7/29]
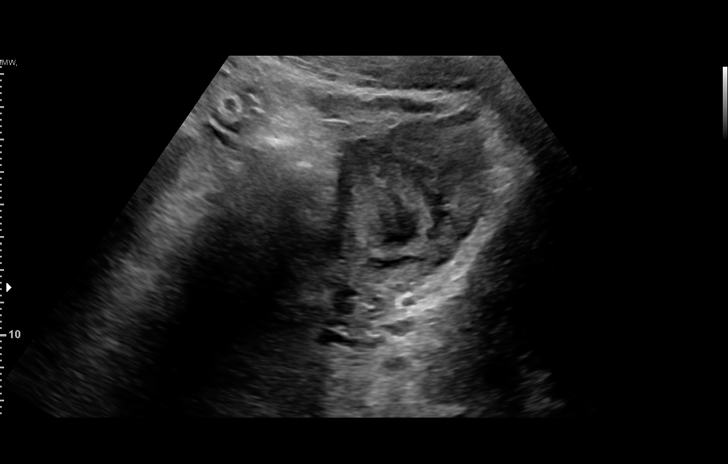
[im 9/29]
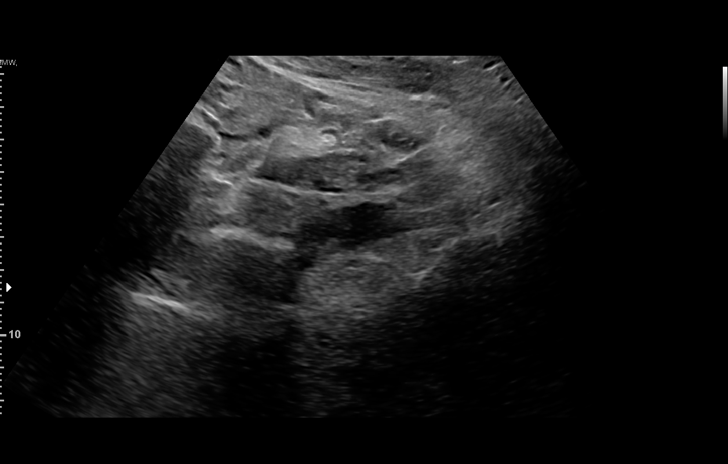
[im 11/29]
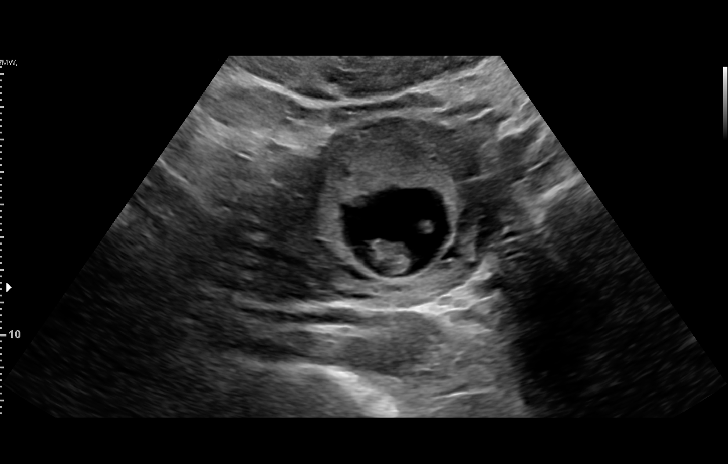
[im 13/29]
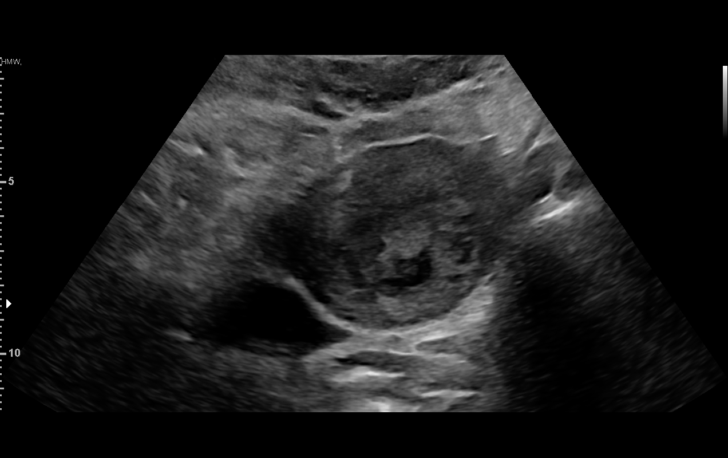
[im 15/29]
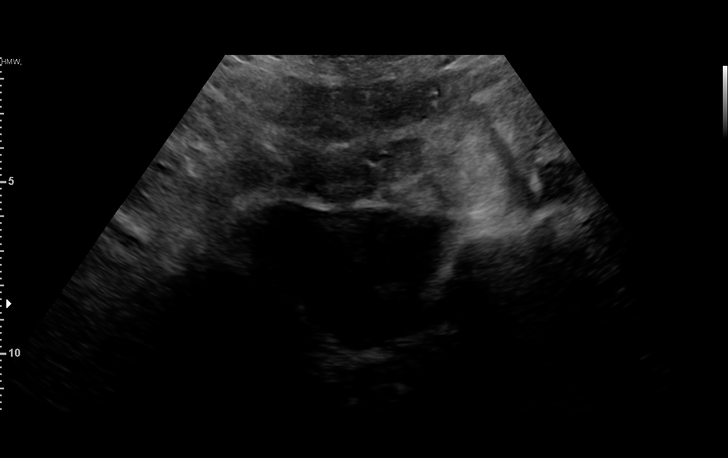
[im 16/29]
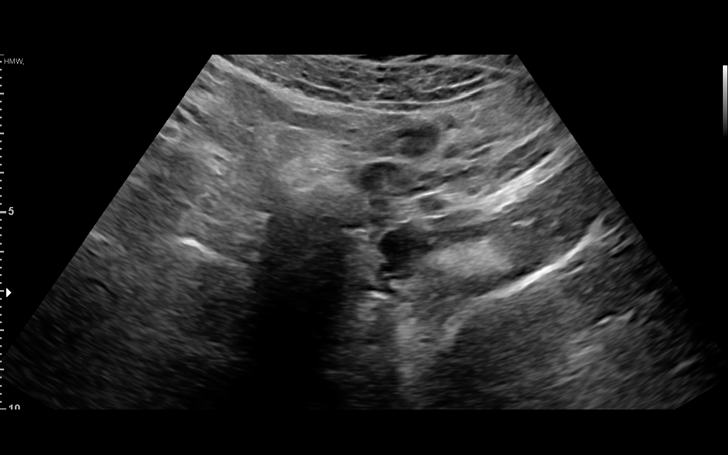
[im 18/29]
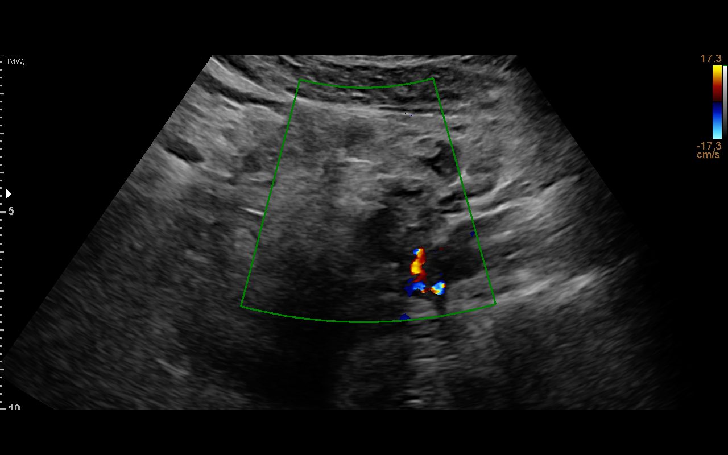
[im 20/29]
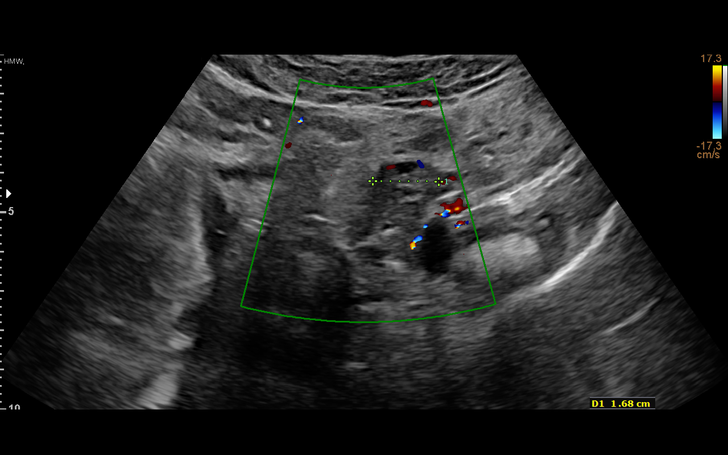
[im 22/29]
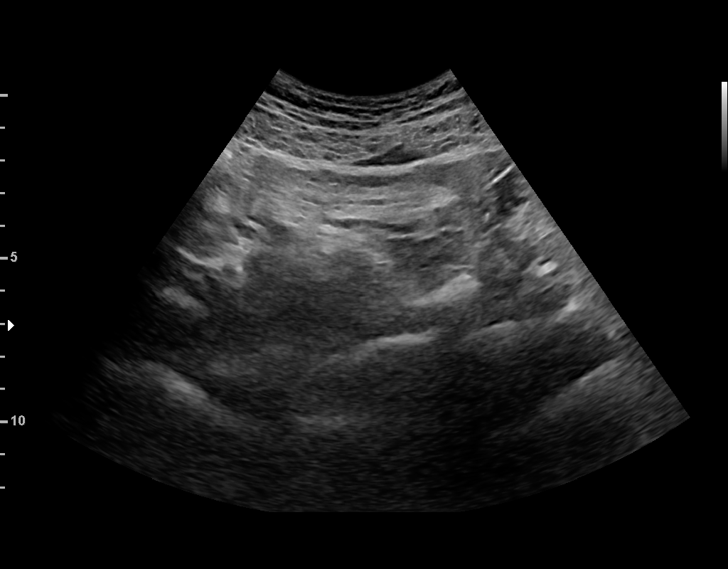
[im 24/29]
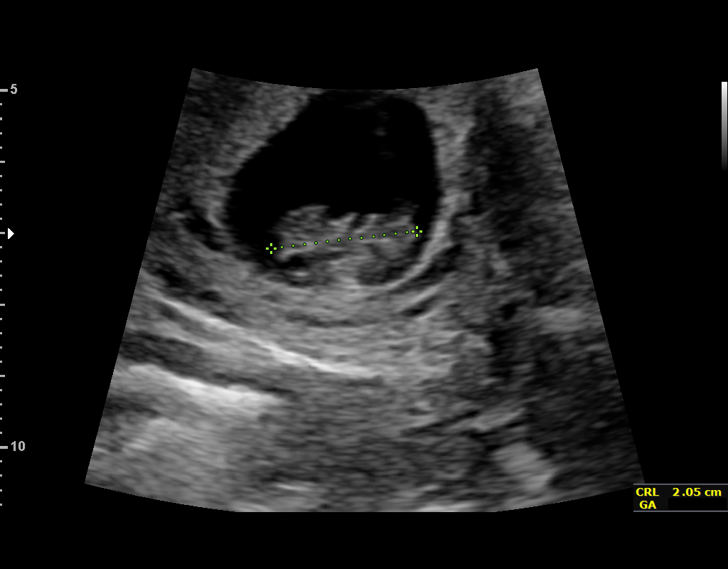
[im 26/29]
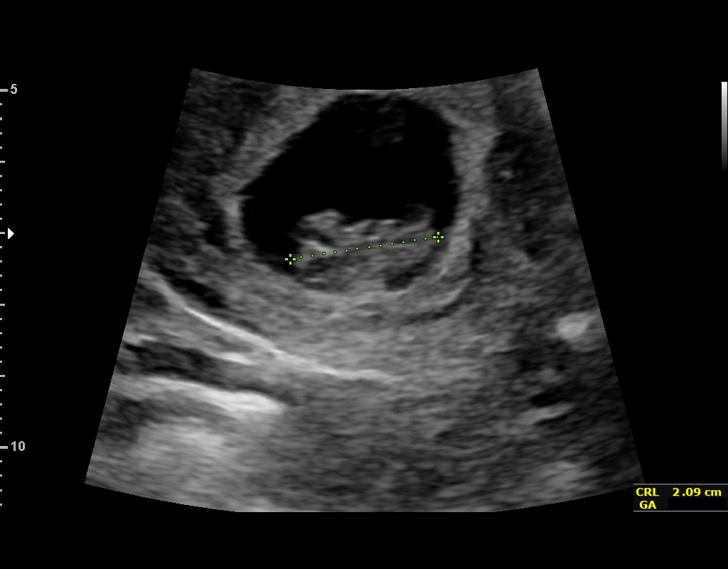
[im 29/29]
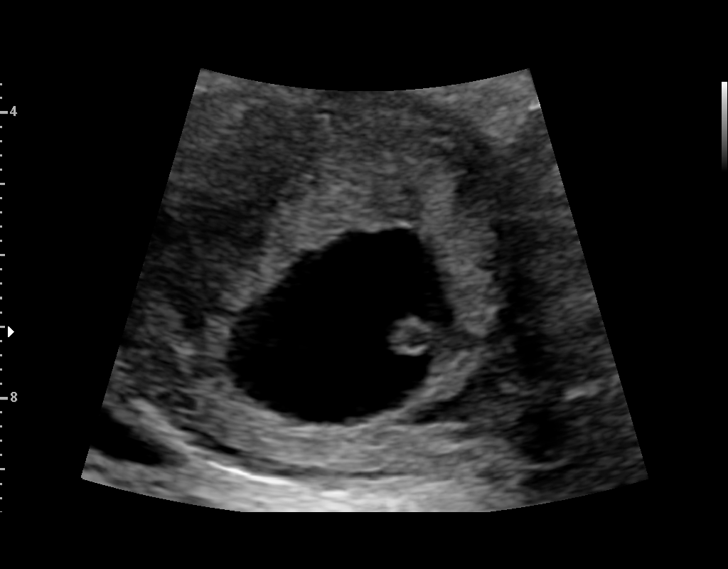

[15 of 28 positions shown; findings below may reference images not displayed]

FINDINGS: Intrauterine gestational sac: Single

Yolk sac:  Visualized.

Embryo:  Visualized.

Cardiac Activity: Visualized.

Heart Rate: 167 bpm

CRL:   22 mm   8 w 5 d                  US EDC: 10/31/2018

Subchorionic hemorrhage:  None visualized.

Maternal uterus/adnexae: Normal appearance of left ovary. Right
ovary is not directly visualized, however no adnexal mass or
abnormal free fluid identified.
IMPRESSION: Single living IUP measuring 8 weeks 5 days, with US EDC of
10/31/2018.

No significant maternal uterine or adnexal abnormality identified.
